# Patient Record
Sex: Female | Born: 1945 | Race: White | Hispanic: No | Marital: Married | Smoking: Former smoker
Health system: Southern US, Community
[De-identification: ages and names within clinical notes are randomized; demographics above are authoritative.]

## PROBLEM LIST (undated history)

## (undated) DIAGNOSIS — J45909 Unspecified asthma, uncomplicated: Secondary | ICD-10-CM

## (undated) DIAGNOSIS — E079 Disorder of thyroid, unspecified: Secondary | ICD-10-CM

## (undated) DIAGNOSIS — D693 Immune thrombocytopenic purpura: Secondary | ICD-10-CM

---

## 2018-09-24 ENCOUNTER — Emergency Department: Payer: Medicare Other

## 2018-09-24 ENCOUNTER — Emergency Department
Admission: EM | Admit: 2018-09-24 | Discharge: 2018-09-24 | Disposition: A | Discharge status: Home / Self Care | Payer: Medicare Other | Attending: Emergency Medicine | Admitting: Emergency Medicine

## 2018-09-24 ENCOUNTER — Encounter: Payer: Self-pay | Admitting: Emergency Medicine

## 2018-09-24 ENCOUNTER — Other Ambulatory Visit: Payer: Self-pay

## 2018-09-24 DIAGNOSIS — J45909 Unspecified asthma, uncomplicated: Secondary | ICD-10-CM | POA: Insufficient documentation

## 2018-09-24 DIAGNOSIS — Y92480 Sidewalk as the place of occurrence of the external cause: Secondary | ICD-10-CM | POA: Insufficient documentation

## 2018-09-24 DIAGNOSIS — S52121A Displaced fracture of head of right radius, initial encounter for closed fracture: Secondary | ICD-10-CM | POA: Diagnosis not present

## 2018-09-24 DIAGNOSIS — R040 Epistaxis: Secondary | ICD-10-CM | POA: Diagnosis not present

## 2018-09-24 DIAGNOSIS — W01198A Fall on same level from slipping, tripping and stumbling with subsequent striking against other object, initial encounter: Secondary | ICD-10-CM | POA: Diagnosis not present

## 2018-09-24 DIAGNOSIS — S59901A Unspecified injury of right elbow, initial encounter: Secondary | ICD-10-CM | POA: Diagnosis present

## 2018-09-24 DIAGNOSIS — Y93K1 Activity, walking an animal: Secondary | ICD-10-CM | POA: Diagnosis not present

## 2018-09-24 DIAGNOSIS — S0990XA Unspecified injury of head, initial encounter: Secondary | ICD-10-CM | POA: Insufficient documentation

## 2018-09-24 DIAGNOSIS — Y998 Other external cause status: Secondary | ICD-10-CM | POA: Diagnosis not present

## 2018-09-24 DIAGNOSIS — S52125A Nondisplaced fracture of head of left radius, initial encounter for closed fracture: Secondary | ICD-10-CM | POA: Insufficient documentation

## 2018-09-24 DIAGNOSIS — S0083XA Contusion of other part of head, initial encounter: Secondary | ICD-10-CM | POA: Diagnosis not present

## 2018-09-24 DIAGNOSIS — Z87891 Personal history of nicotine dependence: Secondary | ICD-10-CM | POA: Diagnosis not present

## 2018-09-24 DIAGNOSIS — S52126A Nondisplaced fracture of head of unspecified radius, initial encounter for closed fracture: Secondary | ICD-10-CM

## 2018-09-24 HISTORY — DX: Disorder of thyroid, unspecified: E07.9

## 2018-09-24 HISTORY — DX: Immune thrombocytopenic purpura: D69.3

## 2018-09-24 HISTORY — DX: Unspecified asthma, uncomplicated: J45.909

## 2018-09-24 MED ORDER — OXYCODONE-ACETAMINOPHEN 5-325 MG PO TABS
1.0000 | ORAL_TABLET | Freq: Once | ORAL | Status: AC
  Administered 2018-09-24: 1 via ORAL
  Filled 2018-09-24: qty 1

## 2018-09-24 MED ORDER — ACETAMINOPHEN 325 MG PO TABS
650.0000 mg | ORAL_TABLET | Freq: Once | ORAL | Status: AC
  Administered 2018-09-24: 650 mg via ORAL
  Filled 2018-09-24: qty 2

## 2018-09-24 MED ORDER — BACITRACIN ZINC 500 UNIT/GM EX OINT
TOPICAL_OINTMENT | Freq: Once | CUTANEOUS | Status: AC
  Administered 2018-09-24: 1 via TOPICAL
  Filled 2018-09-24: qty 2.7

## 2018-09-24 MED ORDER — OXYCODONE-ACETAMINOPHEN 5-325 MG PO TABS: 1.0000 | ORAL_TABLET | Freq: Four times a day (QID) | ORAL | 0 refills | Status: AC | PRN

## 2018-09-24 MED ORDER — IBUPROFEN 200 MG PO TABS: 600.0000 mg | ORAL_TABLET | Freq: Four times a day (QID) | ORAL | 0 refills | Status: AC | PRN

## 2018-09-24 NOTE — ED Provider Notes (Addendum)
[facility] Emergency Department Provider Note  ____________________________________________   I have reviewed the triage vital signs and the nursing notes. Where available I have reviewed prior notes and, if possible and indicated, outside hospital notes.    HISTORY  Chief Complaint Fall    HPI Kathryn Kerr is a 73 y.o. female  Patient seen and evaluated during the coronavirus epidemic during a time with low staffing, patient was out walking her dog, and she tripped on a pre-of pavement and fell, landing on outstretched arms, both knees, and hitting her face.  She did not pass out.  She had a nosebleed which is now stopped.  She has bruising to her face.  She states her hands hurt but she does not believe they are broken she has full range of motion with no bony tenderness.  She states that her knees are "sore" but she does not believe they are broken she was ambulatory at the scene.  Her tetanus is up-to-date.  Past Medical History:  Diagnosis Date  . Asthma   . Chronic ITP (idiopathic thrombocytopenia) (HCC)   . Thyroid disease     There are no active problems to display for this patient.     Prior to Admission medications   Not on File    Allergies Sulfa antibiotics and Amoxicillin  No family history on file.  Social History Social History   Tobacco Use  . Smoking status: Former Research scientist (life sciences)  . Smokeless tobacco: Never Used  Substance Use Topics  . Alcohol use: Not on file  . Drug use: Not on file    Review of Systems Constitutional: No fever/chills Eyes: No visual changes. ENT: No sore throat. No stiff neck no neck pain Cardiovascular: Denies chest pain. Respiratory: Denies shortness of breath. Gastrointestinal:   no vomiting.  No diarrhea.  No constipation. Genitourinary: Negative for dysuria. Musculoskeletal: Negative lower extremity swelling Skin: Negative for rash. Neurological: Negative for severe headaches, focal weakness or  numbness.   ____________________________________________   PHYSICAL EXAM:  VITAL SIGNS: ED Triage Vitals  Enc Vitals Group     BP 09/24/18 1000 135/77     Pulse Rate 09/24/18 1000 87     Resp 09/24/18 1000 18     Temp 09/24/18 1000 98.3 F (36.8 C)     Temp Source 09/24/18 1000 Oral     SpO2 09/24/18 1000 98 %     Weight 09/24/18 0957 185 lb (83.9 kg)     Height 09/24/18 0957 5' 4.5" (1.638 m)     Head Circumference --      Peak Flow --      Pain Score 09/24/18 0956 8     Pain Loc --      Pain Edu? --      Excl. in [place]? --     Constitutional: Alert and oriented. Well appearing and in no acute distress. Eyes: Conjunctivae are normal Head: Large hematoma to forehead, no skull fracture palpated. HEENT: No congestion/rhinnorhea. Mucous membranes are moist.  Oropharynx non-erythematous, no significant dental or jaw trauma noted, however, there is blood in the left nares with no septal hematoma no active bleeding at this time.  Abrasions noted to the face but no lacerations. Neck:   Nontender with no meningismus, no masses, no stridor Cardiovascular: Normal rate, regular rhythm. Grossly normal heart sounds.  Good peripheral circulation. Respiratory: Normal respiratory effort.  No retractions. Lungs CTAB. Abdominal: Soft and nontender. No distention. No guarding no rebound Back:  There is  no focal tenderness or step off.  there is no midline tenderness there are no lesions noted. there is no CVA tenderness Musculoskeletal: Range of motion of both hips both knees both ankles, there is some swelling above the left kneecap but no bony tenderness noted.  Slight abrasion noted to the left knee.  Right knee is full range of motion with a very superficial abrasion.  I can palpate her fingers hands wrists elbows and shoulders with full range of motion no evidence of bony tenderness., no upper extremity tenderness. No joint effusions, no DVT signs strong distal pulses no edema Neurologic:   Normal speech and language. No gross focal neurologic deficits are appreciated.  Skin:  Skin is warm, dry and intact.  Abrasions noted, bilateral knees, and face mostly.Marland Kitchen. Psychiatric: Mood and affect are normal. Speech and behavior are normal.  ____________________________________________   LABS (all labs ordered are listed, but only abnormal results are displayed)  Labs Reviewed - No data to display  Pertinent labs  results that were available during my care of the patient were reviewed by me and considered in my medical decision making (see chart for details). ____________________________________________  EKG  I personally interpreted any EKGs ordered by me or triage  ____________________________________________  RADIOLOGY  Pertinent labs & imaging results that were available during my care of the patient were reviewed by me and considered in my medical decision making (see chart for details). If possible, patient and/or family made aware of any abnormal findings.  No results found. ____________________________________________    PROCEDURES  Procedure(s) performed: None  Procedures  Critical Care performed: None  ____________________________________________   INITIAL IMPRESSION / ASSESSMENT AND PLAN / ED COURSE  Pertinent labs & imaging results that were available during my care of the patient were reviewed by me and considered in my medical decision making (see chart for details).  Patient here with a non-syncopal fall, tetanus up-to-date, face planted on cement essentially.  Does have some injury where she tried to reach out, did not syncopized, did not pass out afterwards, no evidence of significant concussion, given her age even though she is not on blood thinners we obtain imaging of head and face.  I will also obtain x-rays of her left knee although I have low suspicion for fracture.  Not see any indication for x-ray in her arms they are somewhat sore but do not  appear to have any bony tenderness.  We will continue to monitor her closely I will give her Tylenol for discomfort.  ----------------------------------------- 12:38 PM on 09/24/2018 -----------------------------------------  Has now decided that she would like her forearms and her right knee x-rayed as well.  I have low suspicion for fracture but given her fall we will image them as well.  ----------------------------------------- 3:27 PM on 09/24/2018 -----------------------------------------  D/w dr. Martha Clankrasinski who would like bilateral splints if elbow films positive. Signed out to dr. Scotty CourtStafford at the end of my shift.   ____________________________________________   FINAL CLINICAL IMPRESSION(S) / ED DIAGNOSES  Final diagnoses:  None      This chart was dictated using voice recognition software.  Despite best efforts to proofread,  errors can occur which can change meaning.      Jeanmarie PlantMcShane, Zell Doucette A, MD 09/24/18 1006    Jeanmarie PlantMcShane, Zoelle Markus A, MD 09/24/18 1238    Jeanmarie PlantMcShane, Giulietta Prokop A, MD 09/24/18 [phone]

## 2018-09-24 NOTE — ED Notes (Signed)
Pt back to treatment room. NAD at this time. RN introduced self and turned the heat down in pts room per request.

## 2018-09-24 NOTE — ED Notes (Signed)
Pt transported to XR.  

## 2018-09-24 NOTE — ED Notes (Signed)
Wounds cleansed, pt continues to c/o pain in arms, states she cannot move her arms.

## 2018-09-24 NOTE — ED Notes (Signed)
Placed a pillow under each arm for comfort per patients request

## 2018-09-24 NOTE — ED Notes (Signed)
Patient transported to CT and XR 

## 2018-09-24 NOTE — ED Notes (Signed)
Pt able to ambulate independently in room, pt c/o bilateral arm pain from the elbow down and also right knee pain and is requesting an xray.  Updated Dr. Burlene Arnt.

## 2018-09-24 NOTE — Consult Note (Signed)
Called by Dr. Burlene Arnt regarding this 73 y/o female who tripped and fell today while walking her dog.   Patient is reported to be NVI.   I have reviewed her elbow xrays.  Patient has non-displaced fractures of both radial heads.   I recommend posterior splints for both arms and follow up in our clinic early this week.  Patient should call our office tomorrow AM to make an appointment.

## 2018-09-24 NOTE — ED Provider Notes (Addendum)
-----------------------------------------   4:30 PM on 09/24/2018 -----------------------------------------  Dedicated elbow x-rays confirmed bilateral radial head fractures.  Will plan to splint bilateral arms as advised by orthopedics.  I had a long discussion with the patient regarding how this will interfere with her ability to care for herself at home.  I offered to have social work come and evaluate her for SNF placement.  However, she reports that she was going to go back to her other home in [place] today, and has a lot of support there who can help her full-time with ADLs.  She declines placement today and wants to be discharged after splinting.  I discussed with her family member Diane as well who agrees that the patient has a lot of support and that she personally can help with hygiene and showering and toileting.  They will plan to call her primary care doctor in [place] tomorrow.  Marland KitchenSplint Application Date/Time: 5/0/5697 4:34 PM Performed by: Carrie Mew, MD Authorized by: Carrie Mew, MD   Consent:    Consent obtained:  Verbal   Consent given by:  Patient   Risks discussed:  Discoloration, numbness, pain and swelling   Alternatives discussed:  Alternative treatment and referral Pre-procedure details:    Sensation:  Normal Procedure details:    Laterality:  Left   Location:  Elbow   Elbow:  L elbow   Strapping: no     Cast type:  Long arm   Splint type:  Long arm   Supplies:  Ortho-Glass Post-procedure details:    Pain:  Improved   Sensation:  Normal   Patient tolerance of procedure:  Tolerated well, no immediate complications Comments:         .Splint Application Date/Time: 12/22/8014 4:35 PM Performed by: Carrie Mew, MD Authorized by: Carrie Mew, MD   Consent:    Consent obtained:  Verbal   Consent given by:  Patient   Risks discussed:  Discoloration, pain, swelling and numbness   Alternatives discussed:  Referral and alternative  treatment Pre-procedure details:    Sensation:  Normal Procedure details:    Laterality:  Right   Location:  Elbow   Elbow:  R elbow   Strapping: no     Cast type:  Long arm   Splint type:  Long arm   Supplies:  Ortho-Glass Post-procedure details:    Pain:  Improved   Sensation:  Normal   Patient tolerance of procedure:  Tolerated well, no immediate complications       Final diagnoses:  Closed nondisplaced fracture of head of radius, unspecified laterality, initial encounter  bilaeral radial head fractures   Carrie Mew, MD 09/24/18 5537    Carrie Mew, MD 09/24/18 [phone]

## 2018-09-24 NOTE — ED Notes (Signed)
While conducting home medication history, the patient appeared slightly unfocused. She addressed me as "Gerald Stabs" and, near the end of the PTA medication interview, I noticed her looking at me but taking time to reply to my words. She then followed by reporting how her arms hurt, even though I had inquired about what local pharmacy she wanted to have in her chart.   ** The above is intended solely for informational and/or communicative purposes. It should in no way be considered an endorsement of any specific treatment, therapy or action. **

## 2018-09-24 NOTE — ED Notes (Signed)
Pt stable while walking. Splints in place per MD verbal order with slings on both arms. Pt continues to request going home rather than stating in [facility] at this time but verbalized understanding to return if medical assistance is needed.

## 2018-09-24 NOTE — ED Triage Notes (Signed)
Pt arrived via EMS, pt was out walking dog and tripped on uneven sidewalk and fell face first. Pt has hematoma to forehead, and nose bleed, pt also has c/o bilateral arm pain.  Pt states the right arm hurts worse than the left arm.

## 2018-09-24 NOTE — ED Notes (Addendum)
Pt reports painful arms, states she is not going to be able to wipe due to the pain in her arms, pt states the pain is in both arms from her elbow down. Pt states that her pain is unchanged with the tylenol.

## 2020-09-23 IMAGING — DX RIGHT KNEE - COMPLETE 4+ VIEW
4 series · 4 of 4 positions shown · non-contrast
Comparison: None.

CLINICAL DATA: Fall while walking dog today landing on knees. Right
knee pain.

EXAM:
RIGHT KNEE - COMPLETE 4+ VIEW

[knee ap]
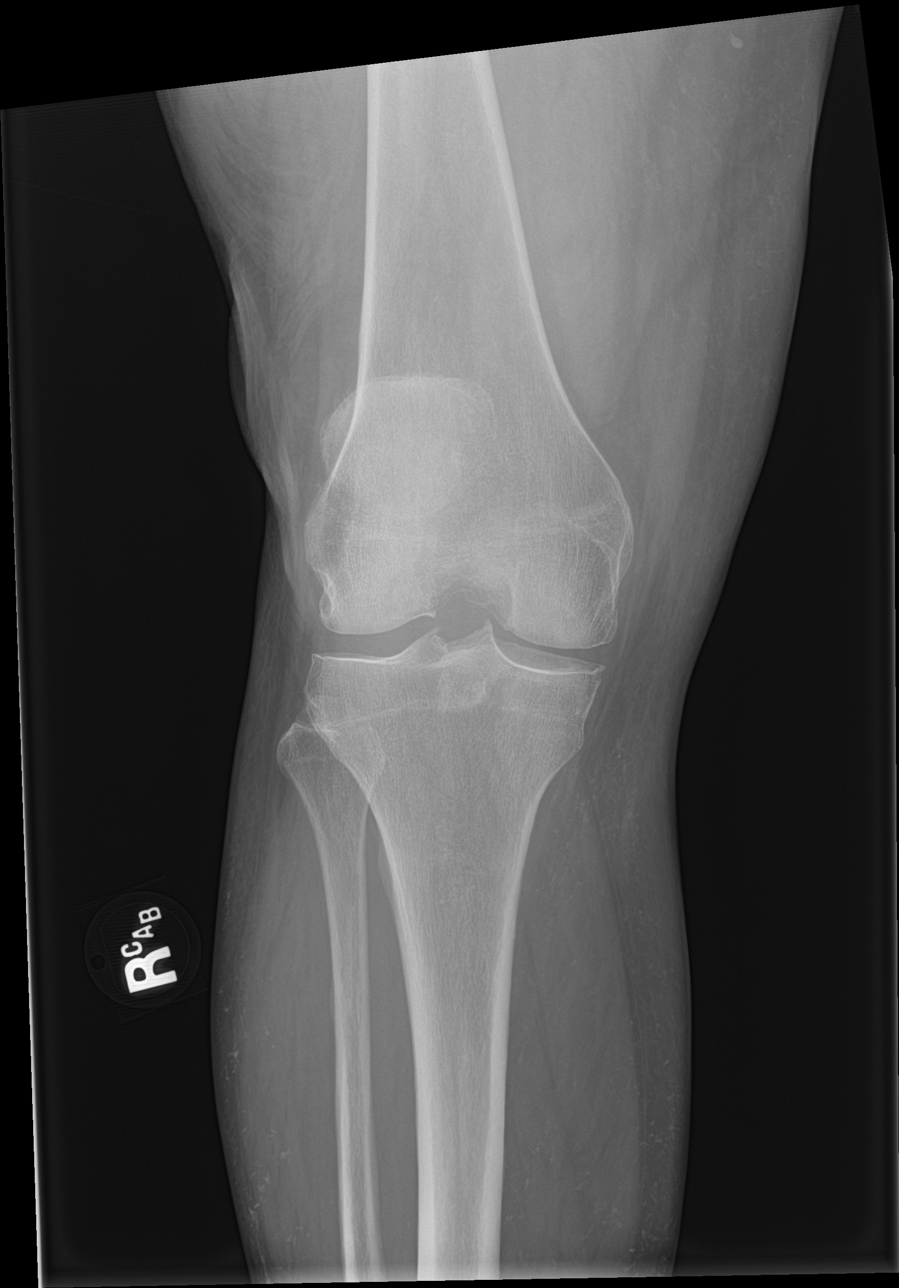

[knee lat]
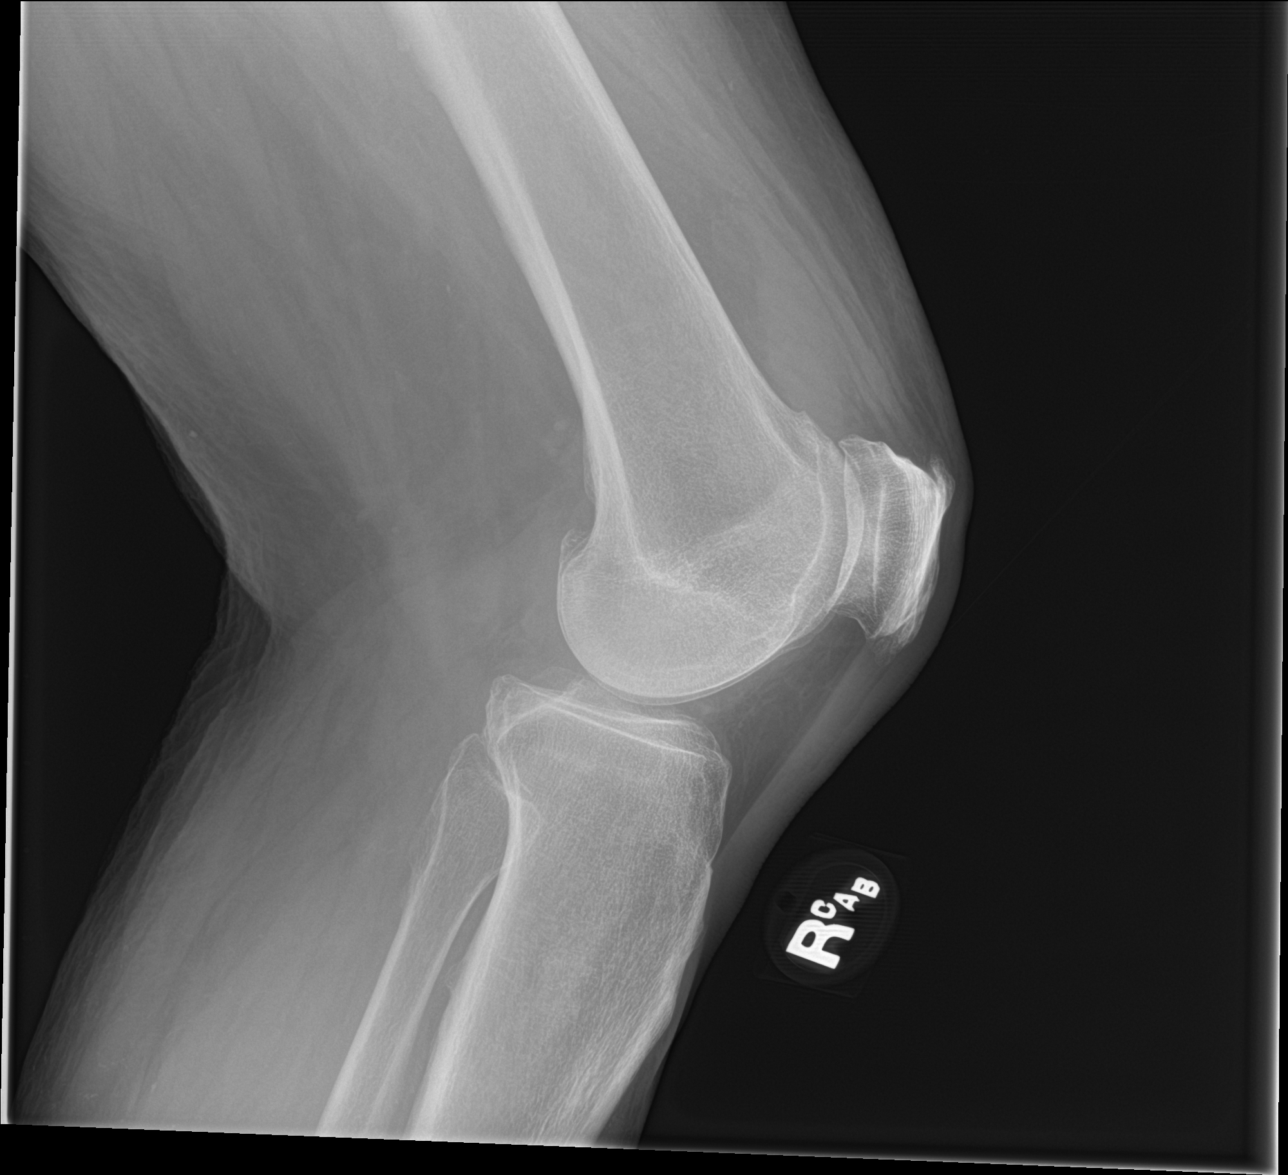

[knee obl (1 of 2)]
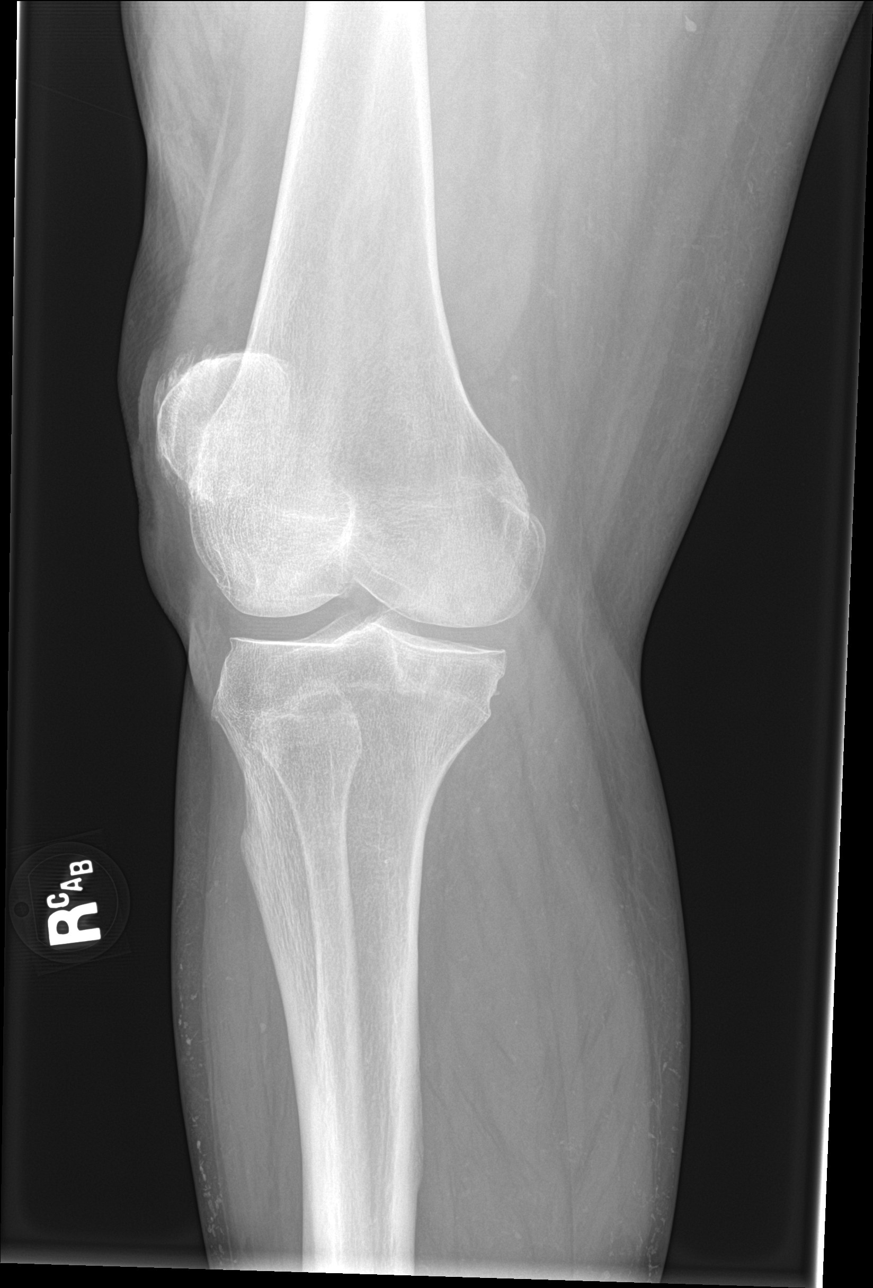

[knee obl (2 of 2)]
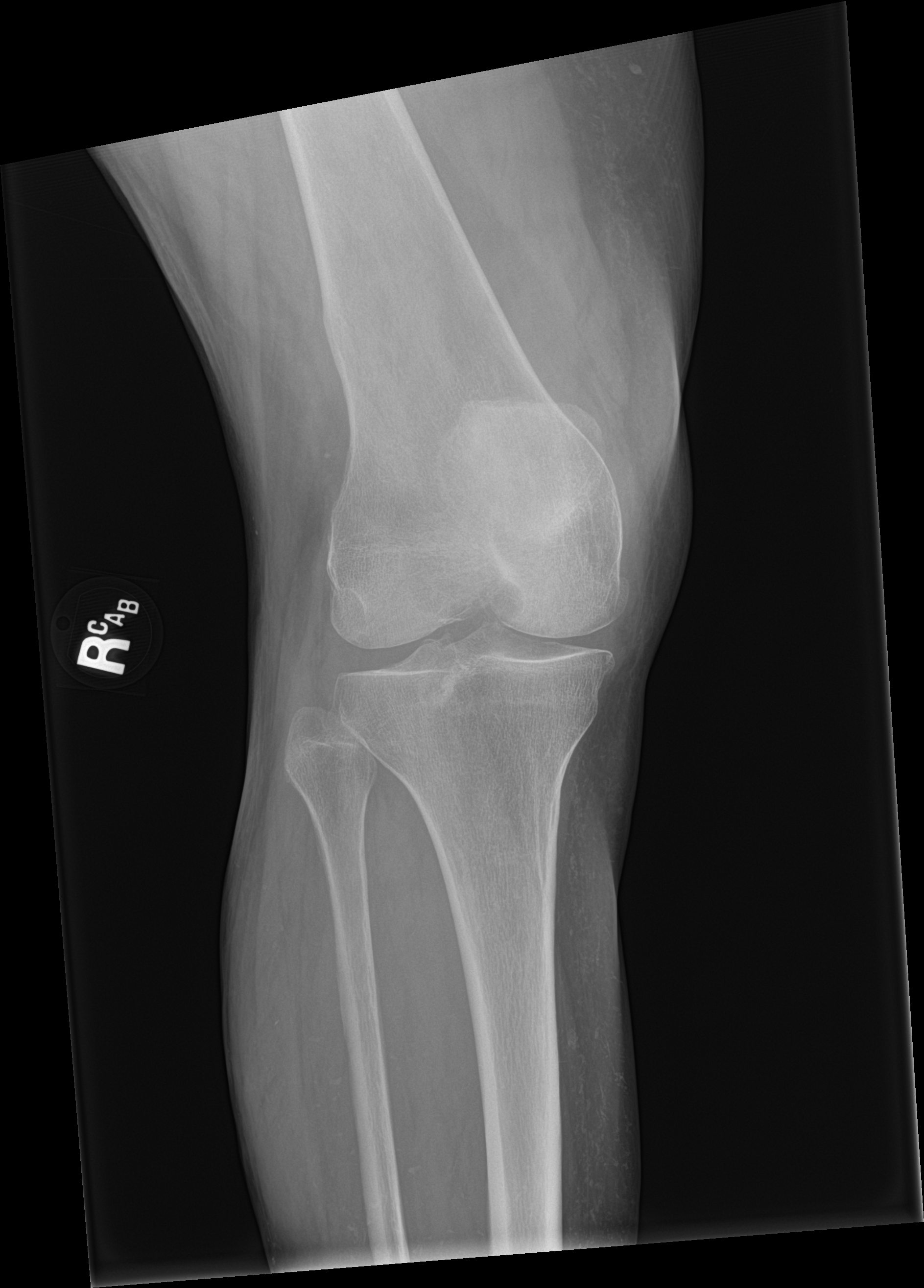

[4 of 4 positions shown; findings below may reference images not displayed]

FINDINGS: Mild tricompartmental osteoarthritic change. Suggestion of a small
joint effusion. No evidence of acute fracture or dislocation.
IMPRESSION: No acute fracture.
